# Patient Record
Sex: Male | Born: 1980
Health system: Southern US, Community
[De-identification: ages and names within clinical notes are randomized; demographics above are authoritative.]

## PROBLEM LIST (undated history)

## (undated) DIAGNOSIS — R160 Hepatomegaly, not elsewhere classified: Secondary | ICD-10-CM

## (undated) DIAGNOSIS — R42 Dizziness and giddiness: Secondary | ICD-10-CM

## (undated) DIAGNOSIS — R51 Headache: Secondary | ICD-10-CM

## (undated) DIAGNOSIS — R569 Unspecified convulsions: Secondary | ICD-10-CM

## (undated) DIAGNOSIS — R519 Headache, unspecified: Secondary | ICD-10-CM

## (undated) DIAGNOSIS — S0990XA Unspecified injury of head, initial encounter: Secondary | ICD-10-CM

## (undated) DIAGNOSIS — R55 Syncope and collapse: Secondary | ICD-10-CM

## (undated) DIAGNOSIS — R748 Abnormal levels of other serum enzymes: Secondary | ICD-10-CM

## (undated) HISTORY — DX: Syncope and collapse: R55

## (undated) HISTORY — DX: Dizziness and giddiness: R42

## (undated) HISTORY — DX: Headache: R51

## (undated) HISTORY — DX: Headache, unspecified: R51.9

## (undated) HISTORY — DX: Unspecified injury of head, initial encounter: S09.90XA

## (undated) HISTORY — DX: Abnormal levels of other serum enzymes: R74.8

## (undated) HISTORY — DX: Hepatomegaly, not elsewhere classified: R16.0

---

## 2017-05-17 DIAGNOSIS — R61 Generalized hyperhidrosis: Secondary | ICD-10-CM | POA: Diagnosis not present

## 2017-05-17 DIAGNOSIS — F1010 Alcohol abuse, uncomplicated: Secondary | ICD-10-CM | POA: Diagnosis not present

## 2017-05-17 DIAGNOSIS — R5383 Other fatigue: Secondary | ICD-10-CM | POA: Diagnosis not present

## 2017-05-17 DIAGNOSIS — R079 Chest pain, unspecified: Secondary | ICD-10-CM | POA: Diagnosis not present

## 2017-05-17 DIAGNOSIS — I10 Essential (primary) hypertension: Secondary | ICD-10-CM | POA: Diagnosis not present

## 2017-05-17 DIAGNOSIS — R509 Fever, unspecified: Secondary | ICD-10-CM | POA: Diagnosis not present

## 2017-05-17 DIAGNOSIS — R0789 Other chest pain: Secondary | ICD-10-CM | POA: Diagnosis not present

## 2017-05-17 DIAGNOSIS — D1803 Hemangioma of intra-abdominal structures: Secondary | ICD-10-CM | POA: Diagnosis not present

## 2017-05-18 DIAGNOSIS — D1803 Hemangioma of intra-abdominal structures: Secondary | ICD-10-CM | POA: Diagnosis not present

## 2017-05-18 DIAGNOSIS — R945 Abnormal results of liver function studies: Secondary | ICD-10-CM | POA: Diagnosis not present

## 2017-05-18 DIAGNOSIS — R079 Chest pain, unspecified: Secondary | ICD-10-CM | POA: Diagnosis not present

## 2017-05-18 DIAGNOSIS — Z6826 Body mass index (BMI) 26.0-26.9, adult: Secondary | ICD-10-CM | POA: Diagnosis not present

## 2017-05-18 DIAGNOSIS — R509 Fever, unspecified: Secondary | ICD-10-CM | POA: Diagnosis not present

## 2017-05-19 DIAGNOSIS — R079 Chest pain, unspecified: Secondary | ICD-10-CM | POA: Diagnosis not present

## 2017-05-19 DIAGNOSIS — R509 Fever, unspecified: Secondary | ICD-10-CM | POA: Diagnosis not present

## 2017-05-19 DIAGNOSIS — Z6827 Body mass index (BMI) 27.0-27.9, adult: Secondary | ICD-10-CM | POA: Diagnosis not present

## 2017-05-19 DIAGNOSIS — D1803 Hemangioma of intra-abdominal structures: Secondary | ICD-10-CM | POA: Diagnosis not present

## 2017-05-20 DIAGNOSIS — Z6827 Body mass index (BMI) 27.0-27.9, adult: Secondary | ICD-10-CM | POA: Diagnosis not present

## 2017-05-20 DIAGNOSIS — R079 Chest pain, unspecified: Secondary | ICD-10-CM | POA: Diagnosis not present

## 2017-05-20 DIAGNOSIS — F1010 Alcohol abuse, uncomplicated: Secondary | ICD-10-CM | POA: Diagnosis not present

## 2017-05-20 DIAGNOSIS — R509 Fever, unspecified: Secondary | ICD-10-CM | POA: Diagnosis not present

## 2017-05-20 DIAGNOSIS — D1803 Hemangioma of intra-abdominal structures: Secondary | ICD-10-CM | POA: Diagnosis not present

## 2017-05-21 DIAGNOSIS — Z6826 Body mass index (BMI) 26.0-26.9, adult: Secondary | ICD-10-CM | POA: Diagnosis not present

## 2017-05-21 DIAGNOSIS — F1010 Alcohol abuse, uncomplicated: Secondary | ICD-10-CM | POA: Diagnosis not present

## 2017-05-21 DIAGNOSIS — R509 Fever, unspecified: Secondary | ICD-10-CM | POA: Diagnosis not present

## 2017-05-21 DIAGNOSIS — R079 Chest pain, unspecified: Secondary | ICD-10-CM | POA: Diagnosis not present

## 2017-05-21 DIAGNOSIS — D1803 Hemangioma of intra-abdominal structures: Secondary | ICD-10-CM | POA: Diagnosis not present

## 2017-05-28 DIAGNOSIS — Z79899 Other long term (current) drug therapy: Secondary | ICD-10-CM | POA: Diagnosis not present

## 2017-05-28 DIAGNOSIS — K7689 Other specified diseases of liver: Secondary | ICD-10-CM | POA: Diagnosis not present

## 2017-05-28 DIAGNOSIS — R748 Abnormal levels of other serum enzymes: Secondary | ICD-10-CM | POA: Diagnosis not present

## 2017-05-28 DIAGNOSIS — R079 Chest pain, unspecified: Secondary | ICD-10-CM | POA: Diagnosis not present

## 2017-05-28 DIAGNOSIS — Z6827 Body mass index (BMI) 27.0-27.9, adult: Secondary | ICD-10-CM | POA: Diagnosis not present

## 2017-05-28 DIAGNOSIS — I10 Essential (primary) hypertension: Secondary | ICD-10-CM | POA: Diagnosis not present

## 2017-05-28 DIAGNOSIS — R932 Abnormal findings on diagnostic imaging of liver and biliary tract: Secondary | ICD-10-CM | POA: Diagnosis not present

## 2017-05-28 DIAGNOSIS — F17220 Nicotine dependence, chewing tobacco, uncomplicated: Secondary | ICD-10-CM | POA: Diagnosis not present

## 2017-06-18 DIAGNOSIS — R748 Abnormal levels of other serum enzymes: Secondary | ICD-10-CM | POA: Diagnosis not present

## 2017-06-18 DIAGNOSIS — I10 Essential (primary) hypertension: Secondary | ICD-10-CM | POA: Diagnosis not present

## 2017-06-18 DIAGNOSIS — K769 Liver disease, unspecified: Secondary | ICD-10-CM | POA: Diagnosis not present

## 2017-08-14 DIAGNOSIS — R748 Abnormal levels of other serum enzymes: Secondary | ICD-10-CM | POA: Diagnosis not present

## 2017-08-14 DIAGNOSIS — R200 Anesthesia of skin: Secondary | ICD-10-CM | POA: Diagnosis not present

## 2017-08-14 DIAGNOSIS — S0990XD Unspecified injury of head, subsequent encounter: Secondary | ICD-10-CM | POA: Diagnosis not present

## 2017-08-14 DIAGNOSIS — R55 Syncope and collapse: Secondary | ICD-10-CM | POA: Diagnosis not present

## 2017-08-14 DIAGNOSIS — R160 Hepatomegaly, not elsewhere classified: Secondary | ICD-10-CM | POA: Diagnosis not present

## 2017-09-04 DIAGNOSIS — Z6827 Body mass index (BMI) 27.0-27.9, adult: Secondary | ICD-10-CM | POA: Diagnosis not present

## 2017-09-04 DIAGNOSIS — R748 Abnormal levels of other serum enzymes: Secondary | ICD-10-CM | POA: Diagnosis not present

## 2017-09-04 DIAGNOSIS — R55 Syncope and collapse: Secondary | ICD-10-CM | POA: Diagnosis not present

## 2017-09-11 DIAGNOSIS — R7989 Other specified abnormal findings of blood chemistry: Secondary | ICD-10-CM | POA: Diagnosis not present

## 2017-09-11 DIAGNOSIS — R748 Abnormal levels of other serum enzymes: Secondary | ICD-10-CM | POA: Diagnosis not present

## 2017-10-17 ENCOUNTER — Encounter: Payer: Self-pay | Admitting: *Deleted

## 2017-10-18 ENCOUNTER — Ambulatory Visit: Payer: BLUE CROSS/BLUE SHIELD | Admitting: Neurology

## 2017-10-18 ENCOUNTER — Encounter (INDEPENDENT_AMBULATORY_CARE_PROVIDER_SITE_OTHER): Payer: Self-pay

## 2017-10-18 ENCOUNTER — Encounter: Payer: Self-pay | Admitting: Neurology

## 2017-10-18 VITALS — BP 152/79 | HR 59 | Ht 69.0 in | Wt 192.0 lb

## 2017-10-18 DIAGNOSIS — R410 Disorientation, unspecified: Secondary | ICD-10-CM | POA: Insufficient documentation

## 2017-10-18 MED ORDER — LAMOTRIGINE 25 MG PO TABS: 25.0000 mg | ORAL_TABLET | Freq: Every day | ORAL | 0 refills | Status: DC

## 2017-10-18 MED ORDER — LAMOTRIGINE ER 200 MG PO TB24: 200.0000 mg | ORAL_TABLET | Freq: Every day | ORAL | 11 refills | Status: DC
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
## 2017-10-24 ENCOUNTER — Ambulatory Visit: Payer: BLUE CROSS/BLUE SHIELD | Admitting: Neurology

## 2017-10-24 DIAGNOSIS — R410 Disorientation, unspecified: Secondary | ICD-10-CM

## 2017-10-26 NOTE — Procedures (Signed)
   HISTORY: 37 year old male, presented with frequent dizziness, sensation going to pass out.  TECHNIQUE:  16 channel EEG was performed based on standard 10-16 international system. One channel was dedicated to EKG, which has demonstrates normal sinus rhythm of 60 beats per minutes.  Upon awakening, the posterior background activity was well-developed, in alpha range, with amplitude of microvoltage, reactive to eye opening and closure.  There was no evidence of epileptiform discharge.  Photic stimulation was performed, which induced a symmetric photic driving.  Hyperventilation was performed, there was no abnormality elicit.  No sleep was achieved.  CONCLUSION: This is a  normal awake EEG.  There is no electrodiagnostic evidence of epileptiform discharge.  Levert FeinsteinYijun Janellie Tennison, M.D. Ph.D.  [facility] [address] [place], [place] [address] Phone: [phone] Fax:      [phone]

## 2017-10-31 ENCOUNTER — Telehealth: Payer: Self-pay | Admitting: Neurology

## 2017-10-31 NOTE — Telephone Encounter (Signed)
Pt states they are waiting for a phone call with EEG results. Please call when you have the chance with results. Thank you.

## 2017-10-31 NOTE — Telephone Encounter (Signed)
Spoke to patient - he is aware that his EEG is normal.

## 2017-11-01 ENCOUNTER — Telehealth: Payer: Self-pay | Admitting: Neurology

## 2017-11-01 NOTE — Telephone Encounter (Signed)
I spoke to the patient I informed him the estimate cost for the exam would be about $652.47 due to deductible hasn't been met. I did offer the payment plan. He stated he wants to think about it and then will get back to me when he is ready to schedule.

## 2018-01-22 ENCOUNTER — Ambulatory Visit: Payer: BLUE CROSS/BLUE SHIELD | Admitting: Neurology

## 2018-01-28 ENCOUNTER — Ambulatory Visit: Payer: BLUE CROSS/BLUE SHIELD | Admitting: Neurology

## 2018-01-28 ENCOUNTER — Telehealth: Payer: Self-pay | Admitting: *Deleted

## 2018-01-28 NOTE — Telephone Encounter (Signed)
No showed follow up appointment. 

## 2018-01-29 ENCOUNTER — Encounter: Payer: Self-pay | Admitting: Neurology

## 2019-12-21 ENCOUNTER — Encounter (HOSPITAL_COMMUNITY): Payer: Self-pay | Admitting: Emergency Medicine

## 2019-12-21 ENCOUNTER — Emergency Department (HOSPITAL_COMMUNITY)
Admission: EM | Admit: 2019-12-21 | Discharge: 2019-12-21 | Disposition: A | Discharge status: Home / Self Care | Payer: BC Managed Care – PPO | Attending: Emergency Medicine | Admitting: Emergency Medicine

## 2019-12-21 ENCOUNTER — Emergency Department (HOSPITAL_COMMUNITY): Payer: BC Managed Care – PPO

## 2019-12-21 ENCOUNTER — Other Ambulatory Visit: Payer: Self-pay

## 2019-12-21 DIAGNOSIS — R52 Pain, unspecified: Secondary | ICD-10-CM | POA: Diagnosis not present

## 2019-12-21 DIAGNOSIS — R569 Unspecified convulsions: Secondary | ICD-10-CM | POA: Diagnosis not present

## 2019-12-21 DIAGNOSIS — G40909 Epilepsy, unspecified, not intractable, without status epilepticus: Secondary | ICD-10-CM | POA: Diagnosis not present

## 2019-12-21 DIAGNOSIS — Z79899 Other long term (current) drug therapy: Secondary | ICD-10-CM | POA: Insufficient documentation

## 2019-12-21 DIAGNOSIS — R9431 Abnormal electrocardiogram [ECG] [EKG]: Secondary | ICD-10-CM | POA: Diagnosis not present

## 2019-12-21 DIAGNOSIS — W19XXXA Unspecified fall, initial encounter: Secondary | ICD-10-CM | POA: Diagnosis not present

## 2019-12-21 DIAGNOSIS — M542 Cervicalgia: Secondary | ICD-10-CM | POA: Diagnosis not present

## 2019-12-21 HISTORY — DX: Unspecified convulsions: R56.9

## 2019-12-21 LAB — ETHANOL: Alcohol, Ethyl (B): <10 mg/dL (ref <10)

## 2019-12-21 LAB — RAPID URINE DRUG SCREEN, HOSP PERFORMED
Amphetamines: NOT DETECTED
Barbiturates: NOT DETECTED
Benzodiazepines: NOT DETECTED
Cocaine: NOT DETECTED
Opiates: NOT DETECTED
Tetrahydrocannabinol: POSITIVE — AB

## 2019-12-21 LAB — BASIC METABOLIC PANEL
Anion gap: 11 (ref 5–15)
BUN: 9 mg/dL (ref 6–20)
CO2: 20 mmol/L — ABNORMAL LOW (ref 22–32)
Calcium: 9.4 mg/dL (ref 8.9–10.3)
Chloride: 107 mmol/L (ref 98–111)
Creatinine, Ser: 0.82 mg/dL (ref 0.61–1.24)
GFR calc Af Amer: >60 mL/min (ref >60)
GFR calc non Af Amer: >60 mL/min (ref >60)
Glucose, Bld: 110 mg/dL — ABNORMAL HIGH (ref 70–99)
Potassium: 5.3 mmol/L — ABNORMAL HIGH (ref 3.5–5.1)
Sodium: 138 mmol/L (ref 135–145)

## 2019-12-21 LAB — HEPATIC FUNCTION PANEL
ALT: 45 U/L — ABNORMAL HIGH (ref 0–44)
AST: 29 U/L (ref 15–41)
Albumin: 3.9 g/dL (ref 3.5–5.0)
Alkaline Phosphatase: 73 U/L (ref 38–126)
Bilirubin, Direct: 0.1 mg/dL (ref 0.0–0.2)
Indirect Bilirubin: 0.8 mg/dL (ref 0.3–0.9)
Total Bilirubin: 0.9 mg/dL (ref 0.3–1.2)
Total Protein: 6.2 g/dL — ABNORMAL LOW (ref 6.5–8.1)

## 2019-12-21 LAB — POTASSIUM: Potassium: 4.1 mmol/L (ref 3.5–5.1)

## 2019-12-21 LAB — CBC
HCT: 45 % (ref 39.0–52.0)
Hemoglobin: 15.3 g/dL (ref 13.0–17.0)
MCH: 30.6 pg (ref 26.0–34.0)
MCHC: 34 g/dL (ref 30.0–36.0)
MCV: 90 fL (ref 80.0–100.0)
Platelets: 299 10*3/uL (ref 150–400)
RBC: 5 MIL/uL (ref 4.22–5.81)
RDW: 12.4 % (ref 11.5–15.5)
WBC: 10.1 10*3/uL (ref 4.0–10.5)
nRBC: 0 % (ref 0.0–0.2)

## 2019-12-21 LAB — URINALYSIS, ROUTINE W REFLEX MICROSCOPIC
Bilirubin Urine: NEGATIVE
Glucose, UA: NEGATIVE mg/dL
Hgb urine dipstick: NEGATIVE
Ketones, ur: NEGATIVE mg/dL
Leukocytes,Ua: NEGATIVE
Nitrite: NEGATIVE
Protein, ur: NEGATIVE mg/dL
Specific Gravity, Urine: 1.005 (ref 1.005–1.030)
pH: 7 (ref 5.0–8.0)

## 2019-12-21 MED ORDER — LEVETIRACETAM 500 MG PO TABS: 500.0000 mg | ORAL_TABLET | Freq: Two times a day (BID) | ORAL | 1 refills | Status: DC

## 2019-12-21 MED ORDER — SODIUM CHLORIDE 0.9 % IV BOLUS
1000.0000 mL | Freq: Once | INTRAVENOUS | Status: AC
  Administered 2019-12-21: 1000 mL via INTRAVENOUS

## 2019-12-21 MED ORDER — LEVETIRACETAM IN NACL 1000 MG/100ML IV SOLN
1000.0000 mg | Freq: Once | INTRAVENOUS | Status: AC
  Administered 2019-12-21: 1000 mg via INTRAVENOUS
  Filled 2019-12-21: qty 100

## 2019-12-21 NOTE — Discharge Instructions (Addendum)
Begin taking the levetiracetam (generic for Keppra) immediately.  Continue to take this medication until told otherwise by the neurologist.  Follow-up with the neurologist as soon as possible.  Per [facility] statutes, patients with seizures are not allowed to drive until  they have been seizure-free for six months. Use caution when using heavy equipment or power tools. Avoid working on ladders or at heights. Take showers instead of baths. Ensure the water temperature is not too high on the home water heater. Do not go swimming alone. When caring for infants or small children, sit down when holding, feeding, or changing them to minimize risk of injury to the child in the event you have a seizure.   Also, Maintain good sleep hygiene. Avoid alcohol.   --> Call 911 and bring the patient back to the ED if:                   A.  The seizure lasts longer than 5 minutes.                  B.  The patient doesn't awaken shortly after the seizure             C.  The patient has new problems such as difficulty seeing, speaking or moving             D.  The patient was injured during the seizure             E.  The patient has a temperature over 102 F (39C)             F.  The patient vomited and now is having trouble breathing   For prescription assistance, may try using prescription discount sites or apps, such as goodrx.com

## 2019-12-21 NOTE — ED Notes (Signed)
Patient transported to CT 
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
## 2019-12-21 NOTE — ED Notes (Signed)
Pt back from CT
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
## 2019-12-22 ENCOUNTER — Telehealth: Payer: Self-pay | Admitting: Neurology

## 2019-12-22 NOTE — Telephone Encounter (Signed)
ED has referred patient back to [place] for seizure-like activity. It looks like they were wanting pt to f/u w/ Dr. Terrace Arabia pretty soon. Dr. Terrace Arabia, could you review urgency?

## 2019-12-22 NOTE — Telephone Encounter (Signed)
Yes, we need to see him soon, it is OK to put on Sarah's schedule

## 2019-12-23 ENCOUNTER — Encounter: Payer: Self-pay | Admitting: Neurology

## 2019-12-23 ENCOUNTER — Ambulatory Visit: Payer: BC Managed Care – PPO | Admitting: Neurology

## 2019-12-23 ENCOUNTER — Other Ambulatory Visit: Payer: Self-pay

## 2019-12-23 VITALS — BP 142/82 | HR 61 | Temp 97.3°F | Ht 69.0 in | Wt 194.0 lb

## 2019-12-23 DIAGNOSIS — G40209 Localization-related (focal) (partial) symptomatic epilepsy and epileptic syndromes with complex partial seizures, not intractable, without status epilepticus: Secondary | ICD-10-CM | POA: Insufficient documentation

## 2019-12-23 MED ORDER — LEVETIRACETAM ER 500 MG PO TB24: 1000.0000 mg | ORAL_TABLET | Freq: Every day | ORAL | 4 refills | Status: AC

## 2019-12-23 NOTE — Progress Notes (Signed)
PATIENT: Dustin Sharp DOB: [birth year]  Chief Complaint  Patient presents with  . Seizures    He is here with his wife, Trudie Buckler. ED follow up for seizure-like activity on 12/21/19. Reports onset of seizures in 2017. He is prescribed Keppra 500mg , one tab BID but only takes is once daily. He has a hard time remembering it.  PCP    [name], MD (referred from hospital)     HISTORICAL  Dustin Sharp, is accompanied by his wife to follow-up for seizure  He presented to emergency room on December 21, 2019 following a seizure, he was in sleep, his wife heard a loud moaning sound, when she check on him, he was noted to leaning in his back, with hyperextension of his neck and back, foaming and blood coming out of his mouth, he has bitten his tongue, EMS was called, patient has no recollection of the event, he remember he was in the sleep, then woke up still in the bed achy all over, with the EMS surrounding him  He was taken to the emergency room, I personally reviewed CT head without contrast that was normal,  Laboratory evaluation: UDS was positive for marijuana, normal liver functional test, negative alcohol, normal CBC, BMP showed elevated potassium 5.3  He was discharged with Keppra 500 mg twice daily, just started taking that yesterday December 22, 2019, only 1 dose at nighttime, he has hard time remembering taking it twice  I have seen him in 2019, he had a history of heavy alcohol use, up to 12 beers each day, he quit suddenly in August 2018, aft he was found to have right hepatic lobe lesion, that is most consistent with atypical hemangioma, shortly afterwards, he suffered left arm paresthesia, chest pain, fever, require hospital admission  Since then, he began to have recurrent spells of sudden onset dj vu, once every few weeks, sometimes can happen frequently, sometimes can have few weeks in between, he described sudden protrusion of the dj vu, weird feeling, lasting for few  minutes, followed by extreme fatigue afterwards  Suspicious for partial seizure, I ordered MRI of brain with without contrast, EEG in January 2019, give him prescription of lamotrigine, but  he lost to follow-up, never take medication,  Over the years, he continue have frequent spells of dj vu, usually in the early morning times when he first wake up from overnight sleep, he described it as has been to a familiar seeing, dizziness, rushing of the thoughts, euphoria sensation, lasting for few minutes,  He did not have a history of head injury, a tree limb fell on his head in 2014 with loss of consciousness in 2014, but he did not have spells following his head injury, this only happened after he withdraw from alcohol abruptly in August 2018, with associated fever then,  REVIEW OF SYSTEMS: Full 14 system review of systems performed and notable only for as above All other review of systems were negative.  ALLERGIES: No Known Allergies  HOME MEDICATIONS: Current Outpatient Medications  Medication Sig Dispense Refill  . levETIRAcetam (KEPPRA) 500 MG tablet Take 1 tablet (500 mg total) by mouth 2 (two) times daily. 60 tablet 1   No current facility-administered medications for this visit.    PAST MEDICAL HISTORY: Past Medical History:  Diagnosis Date  . Dizziness   . Elevated liver enzymes   . Head injury    2014  . Headache   . Liver mass   . Near syncope   .  Seizures (HCC)     PAST SURGICAL HISTORY: History reviewed. No pertinent surgical history.  FAMILY HISTORY: Family History  Problem Relation Age of Onset  . Hypertension Mother   . Hypertension Father     SOCIAL HISTORY: Social History   Socioeconomic History  . Marital status: Married    Spouse name: Not on file  . Number of children: 1  . Years of education: 35  . Highest education level: High school graduate  Occupational History  . Occupation: Scientist, water quality  Tobacco Use  . Smoking status: Never Smoker  .  Smokeless tobacco: Never Used  Substance and Sexual Activity  . Alcohol use: No  . Drug use: No  . Sexual activity: Not on file  Other Topics Concern  . Not on file  Social History Narrative   Lives at home with his wife and child.   Right-handed.   One soda per day.   Social Determinants of Health   Financial Resource Strain:   . Difficulty of Paying Living Expenses:   Food Insecurity:   . Worried About Programme researcher, broadcasting/film/video in the Last Year:   . Barista in the Last Year:   Transportation Needs:   . Freight forwarder (Medical):   [name] Lack of Transportation (Non-Medical):   Physical Activity:   . Days of Exercise per Week:   . Minutes of Exercise per Session:   Stress:   . Feeling of Stress :   Social Connections:   . Frequency of Communication with Friends and Family:   . Frequency of Social Gatherings with Friends and Family:   . Attends Religious Services:   . Active Member of Clubs or Organizations:   . Attends Banker Meetings:   [name] Marital Status:   Intimate Partner Violence:   . Fear of Current or Ex-Partner:   . Emotionally Abused:   [name] Physically Abused:   . Sexually Abused:      PHYSICAL EXAM   Vitals:   12/23/19 0910  BP: (!) 142/82  Pulse: 61  Temp: (!) 97.3 F (36.3 C)  Weight: 194 lb (88 kg)  Height: 5\' 9"  (1.753 m)    Not recorded      Body mass index is 28.65 kg/m.  PHYSICAL EXAMNIATION:  Gen: NAD, conversant, well nourised, well groomed                     Cardiovascular: Regular rate rhythm, no peripheral edema, warm, nontender. Eyes: Conjunctivae clear without exudates or hemorrhage Neck: Supple, no carotid bruits. Pulmonary: Clear to auscultation bilaterally   NEUROLOGICAL EXAM:  MENTAL STATUS: Speech:    Speech is normal; fluent and spontaneous with normal comprehension.  Cognition:     Orientation to time, place and person     Normal recent and remote memory     Normal Attention span and  concentration     Normal Language, naming, repeating,spontaneous speech     Fund of knowledge   CRANIAL NERVES: CN II: Visual fields are full to confrontation. Pupils are round equal and briskly reactive to light. CN III, IV, VI: extraocular movement are normal. No ptosis. CN V: Facial sensation is intact to light touch CN VII: Face is symmetric with normal eye closure  CN VIII: Hearing is normal to causal conversation. CN IX, X: Phonation is normal. CN XI: Head turning and shoulder shrug are intact  MOTOR: There is no pronator drift of out-stretched arms. Muscle bulk and  tone are normal. Muscle strength is normal.  REFLEXES: Reflexes are 2+ and symmetric at the biceps, triceps, knees, and ankles. Plantar responses are flexor.  SENSORY: Intact to light touch, pinprick and vibratory sensation are intact in fingers and toes.  COORDINATION: There is no trunk or limb dysmetria noted.  GAIT/STANCE: Posture is normal. Gait is steady with normal steps, base, arm swing, and turning. Heel and toe walking are normal. Tandem gait is normal.  Romberg is absent.   DIAGNOSTIC DATA (LABS, IMAGING, TESTING) - I reviewed patient records, labs, notes, testing and imaging myself where available.   ASSESSMENT AND PLAN  Dustin Sharp is a 39 y.o. 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 Ph.D.  [facility] [address], [place], [place] [address] Ph: [phone] Fax: [phone]  CC: Serita Grammes, MD

## 2019-12-29 ENCOUNTER — Telehealth: Payer: Self-pay | Admitting: Neurology

## 2019-12-29 NOTE — Telephone Encounter (Signed)
unable to get a hold of the patient the vmail has not been set up  [employer]: [id number] (exp. 12/29/19 to 06/25/20)

## 2020-01-02 ENCOUNTER — Telehealth: Payer: Self-pay | Admitting: Neurology

## 2020-01-02 NOTE — Telephone Encounter (Signed)
Pt wife(Pilant, HEIDI) is asking to be called for the scheduling of the MRI

## 2020-01-05 NOTE — Telephone Encounter (Signed)
I spoke to [facility] on the DPR she is going to give me a call back to schedule.  [employer]: [id number] (exp. 12/29/19 to 06/25/20)

## 2020-01-05 NOTE — Telephone Encounter (Signed)
Patient is scheduled at [facility] for 01/14/20.

## 2020-01-07 ENCOUNTER — Ambulatory Visit: Payer: BC Managed Care – PPO | Admitting: Neurology

## 2020-01-07 ENCOUNTER — Other Ambulatory Visit: Payer: BC Managed Care – PPO

## 2020-01-07 DIAGNOSIS — G40209 Localization-related (focal) (partial) symptomatic epilepsy and epileptic syndromes with complex partial seizures, not intractable, without status epilepticus: Secondary | ICD-10-CM | POA: Diagnosis not present

## 2020-01-07 NOTE — Procedures (Signed)
   HISTORY: 39 year old male, presented with seizure on December 21, 2019.   TECHNIQUE:  This is a routine 16 channel EEG recording with one channel devoted to a limited EKG recording.  It was performed during wakefulness, drowsiness and asleep.  Hyperventilation and photic stimulation were performed as activating procedures.  There are minimum muscle and movement artifact noted.  Upon maximum arousal, posterior dominant waking rhythm consistent of rhythmic alpha range activity, with frequency of 10 hz. Activities are symmetric over the bilateral posterior derivations and attenuated with eye opening.  Hyperventilation produced mild/moderate buildup with higher amplitude and the slower activities noted.  Photic stimulation did not alter the tracing.  During EEG recording, patient developed drowsiness and no deeper stage of sleep was achieved During EEG recording, there was no epileptiform discharge noted.  EKG demonstrate sinus rhythm, with heart rate of 60 bpm  CONCLUSION: This is a  normal awake EEG.  There is no electrodiagnostic evidence of epileptiform discharge.  Levert Feinstein, M.D. Ph.D.  [facility] [address] [place], [place] [address] Phone: [phone] Fax:      [phone]

## 2020-01-14 ENCOUNTER — Ambulatory Visit: Payer: BC Managed Care – PPO

## 2020-01-14 DIAGNOSIS — G40209 Localization-related (focal) (partial) symptomatic epilepsy and epileptic syndromes with complex partial seizures, not intractable, without status epilepticus: Secondary | ICD-10-CM

## 2020-01-14 MED ORDER — GADOBENATE DIMEGLUMINE 529 MG/ML IV SOLN
15.0000 mL | Freq: Once | INTRAVENOUS | Status: AC | PRN
  Administered 2020-01-14: 12:00:00 15 mL via INTRAVENOUS

## 2020-01-15 ENCOUNTER — Telehealth: Payer: Self-pay | Admitting: Neurology

## 2020-01-15 NOTE — Telephone Encounter (Signed)
Pt's wife [place]idi on [place] called wanting to know if the pt's EEG results are in. Please advise.

## 2020-01-15 NOTE — Telephone Encounter (Signed)
CONCLUSION: This is a  normal awake EEG.  There is no electrodiagnostic evidence of epileptiform discharge.  I left a detailed message (ok per DPR) that his EEG was normal. I informed them that another call would be made to them once his MRI brain results are back. I also directed him to continue taking the Keppra XR as prescribed and still no driving until he is six months event free (per [place] law). Provided our number to call back with any questions.

## 2020-01-16 ENCOUNTER — Telehealth: Payer: Self-pay | Admitting: *Deleted

## 2020-01-16 NOTE — Telephone Encounter (Signed)
-----   Message from Levert Feinstein, MD sent at 01/15/2020  6:45 PM EDT ----- Please call pt for normal MRI brain.

## 2020-01-16 NOTE — Telephone Encounter (Signed)
I spoke to the patient and he verbalized understanding of his MRI brain results.  

## 2020-02-17 ENCOUNTER — Telehealth: Payer: Self-pay | Admitting: Neurology

## 2020-02-17 ENCOUNTER — Encounter: Payer: Self-pay | Admitting: *Deleted

## 2020-02-17 MED ORDER — LAMOTRIGINE 25 MG PO TABS: ORAL_TABLET | ORAL | 0 refills | Status: AC

## 2020-02-17 MED ORDER — LAMOTRIGINE ER 200 MG PO TB24: 200.0000 mg | ORAL_TABLET | Freq: Every day | ORAL | 3 refills | Status: AC

## 2020-02-17 NOTE — Telephone Encounter (Signed)
Pt's wife(on DPR) has called to report that Keppra is causing: anger issues, not working and causing alopecia for pt.  Please call

## 2020-02-17 NOTE — Addendum Note (Signed)
Addended by: Lilla Shook on: 02/17/2020 05:42 PM   Modules accepted: Orders

## 2020-02-17 NOTE — Telephone Encounter (Signed)
Week Lamotrigine Keppra xr 500mg   1st week 25mg  twice a day 2 tab every night  2nd week 25mg x2 tab twice aday 2 tab every night  3rd week 25mg x3tab twice aday 1 tab every night  4th week 100mg  twice a day stop   The other options are once he finished titrating Lamotrigine to 100mg  bid, may switch to Lamotrigine xr 200mg  qhs

## 2020-02-17 NOTE — Telephone Encounter (Signed)
I spoke to the patient's wife and reviewed the plan below in detail. I requested she write the directions down and she read them back to me correctly. She feels like he will do better by eventually transitioning to lamotrigine XR 200mg  QHS. He will titrate up with the 25mg  for four weeks then start the extended release medication. Two separate prescriptions have been sent to the pharmacy. Provided our number for her to call back with any questions. She also verbalized understanding to contact our office immediately to report any rash that may develop after he starts lamotrigine.

## 2020-02-17 NOTE — Telephone Encounter (Signed)
I spoke to his wife. In addition to the complaints below, she reports that he is still having feelings of deja vu. His wife states he has been compliant with the Keppra XR 500mg , two tabs QHS. They would like to discuss an alternate medications.

## 2020-02-17 NOTE — Addendum Note (Signed)
Addended by: Lindell Spar C on: 02/17/2020 11:07 AM   Modules accepted: Orders

## 2020-04-30 DIAGNOSIS — Z5329 Procedure and treatment not carried out because of patient's decision for other reasons: Secondary | ICD-10-CM | POA: Diagnosis not present

## 2020-04-30 DIAGNOSIS — R569 Unspecified convulsions: Secondary | ICD-10-CM | POA: Diagnosis not present

## 2020-05-04 DIAGNOSIS — D72829 Elevated white blood cell count, unspecified: Secondary | ICD-10-CM | POA: Diagnosis not present

## 2020-05-04 DIAGNOSIS — R7401 Elevation of levels of liver transaminase levels: Secondary | ICD-10-CM | POA: Diagnosis not present

## 2020-05-04 DIAGNOSIS — G40209 Localization-related (focal) (partial) symptomatic epilepsy and epileptic syndromes with complex partial seizures, not intractable, without status epilepticus: Secondary | ICD-10-CM | POA: Diagnosis not present

## 2020-05-04 DIAGNOSIS — Z6826 Body mass index (BMI) 26.0-26.9, adult: Secondary | ICD-10-CM | POA: Diagnosis not present

## 2020-06-22 DIAGNOSIS — G40209 Localization-related (focal) (partial) symptomatic epilepsy and epileptic syndromes with complex partial seizures, not intractable, without status epilepticus: Secondary | ICD-10-CM | POA: Diagnosis not present

## 2020-06-24 ENCOUNTER — Ambulatory Visit: Payer: BC Managed Care – PPO | Admitting: Neurology

## 2021-12-08 DIAGNOSIS — Z0000 Encounter for general adult medical examination without abnormal findings: Secondary | ICD-10-CM | POA: Diagnosis not present

## 2021-12-08 DIAGNOSIS — E785 Hyperlipidemia, unspecified: Secondary | ICD-10-CM | POA: Diagnosis not present

## 2021-12-08 DIAGNOSIS — Z6829 Body mass index (BMI) 29.0-29.9, adult: Secondary | ICD-10-CM | POA: Diagnosis not present

## 2021-12-08 DIAGNOSIS — Z1331 Encounter for screening for depression: Secondary | ICD-10-CM | POA: Diagnosis not present

## 2021-12-08 DIAGNOSIS — Z2821 Immunization not carried out because of patient refusal: Secondary | ICD-10-CM | POA: Diagnosis not present

## 2021-12-12 DIAGNOSIS — R7401 Elevation of levels of liver transaminase levels: Secondary | ICD-10-CM | POA: Diagnosis not present

## 2021-12-12 DIAGNOSIS — E559 Vitamin D deficiency, unspecified: Secondary | ICD-10-CM | POA: Diagnosis not present

## 2021-12-12 DIAGNOSIS — R635 Abnormal weight gain: Secondary | ICD-10-CM | POA: Diagnosis not present

## 2021-12-14 DIAGNOSIS — R7401 Elevation of levels of liver transaminase levels: Secondary | ICD-10-CM | POA: Diagnosis not present

## 2021-12-14 DIAGNOSIS — K760 Fatty (change of) liver, not elsewhere classified: Secondary | ICD-10-CM | POA: Diagnosis not present

## 2021-12-14 DIAGNOSIS — R945 Abnormal results of liver function studies: Secondary | ICD-10-CM | POA: Diagnosis not present

## 2022-01-11 DIAGNOSIS — Z6828 Body mass index (BMI) 28.0-28.9, adult: Secondary | ICD-10-CM | POA: Diagnosis not present

## 2022-01-11 DIAGNOSIS — R7401 Elevation of levels of liver transaminase levels: Secondary | ICD-10-CM | POA: Diagnosis not present

## 2022-01-11 DIAGNOSIS — E559 Vitamin D deficiency, unspecified: Secondary | ICD-10-CM | POA: Diagnosis not present

## 2022-01-11 DIAGNOSIS — K760 Fatty (change of) liver, not elsewhere classified: Secondary | ICD-10-CM | POA: Diagnosis not present

## 2022-01-26 IMAGING — CT CT HEAD W/O CM
4 series · 16 of 47 positions shown, 18 images · non-contrast
Comparison: CT head 09/12/2013

CLINICAL DATA: Seizure.

EXAM:
CT HEAD WITHOUT CONTRAST
TECHNIQUE: Contiguous axial images were obtained from the base of the skull
through the vertex without intravenous contrast.

[Series 3: head wo · axial · 0.46mm/px · z∈[+1372,+1492]mm · 7 of 34 slices shown, 9 images]
[im 5/34  brain]
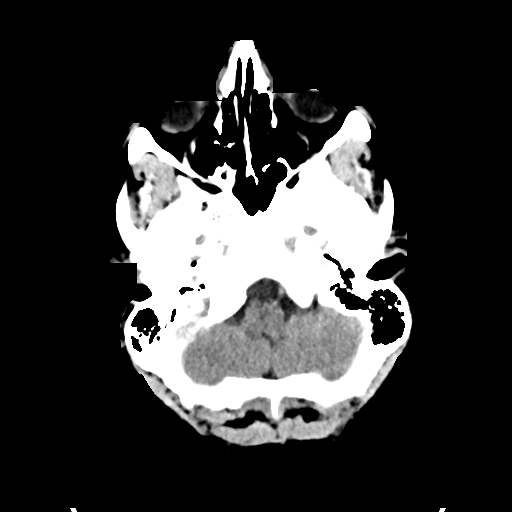
[im 5/34  bone]
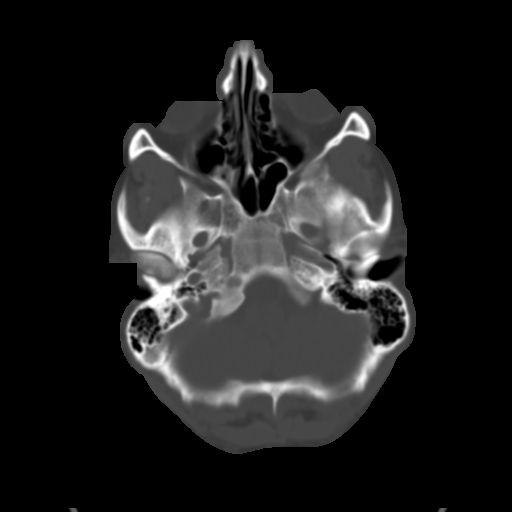
[im 9/34  brain]
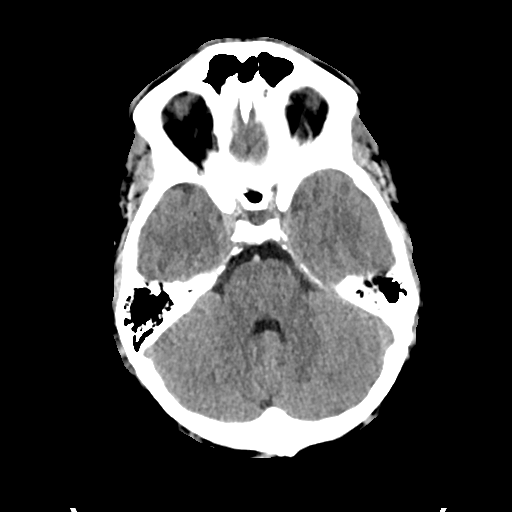
[im 13/34  brain]
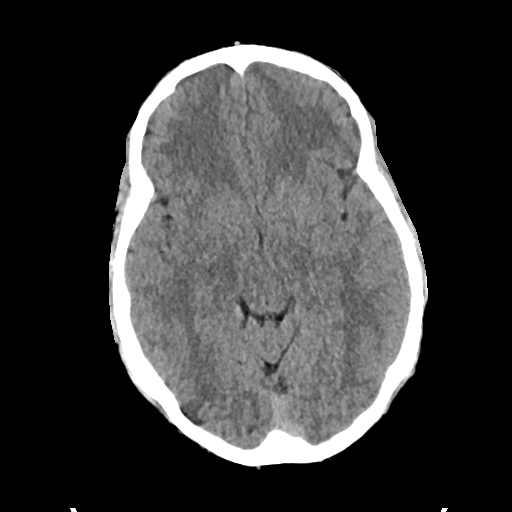
[im 17/34  brain]
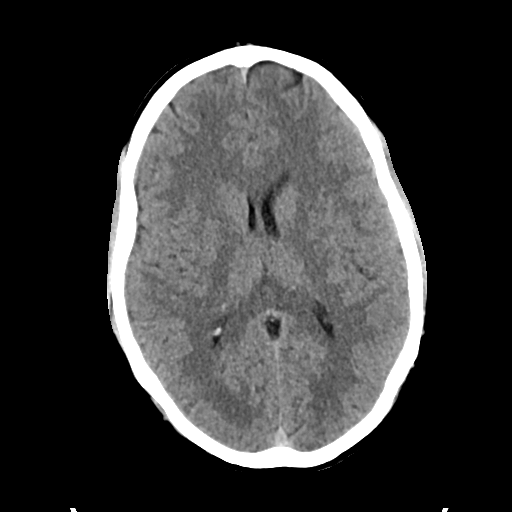
[im 21/34  brain]
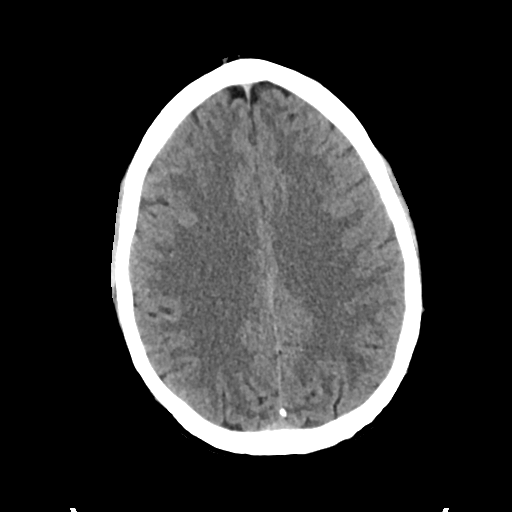
[im 21/34  bone]
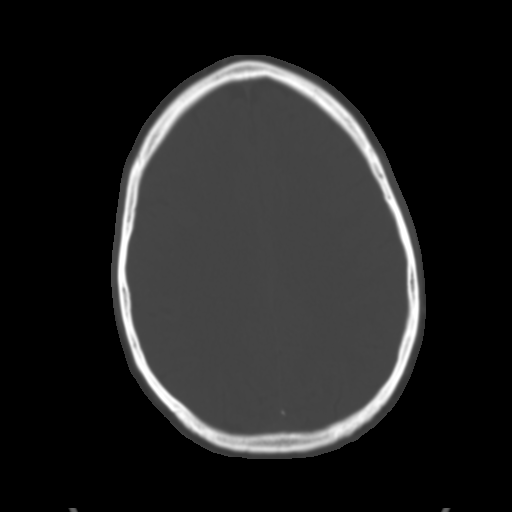
[im 25/34  brain]
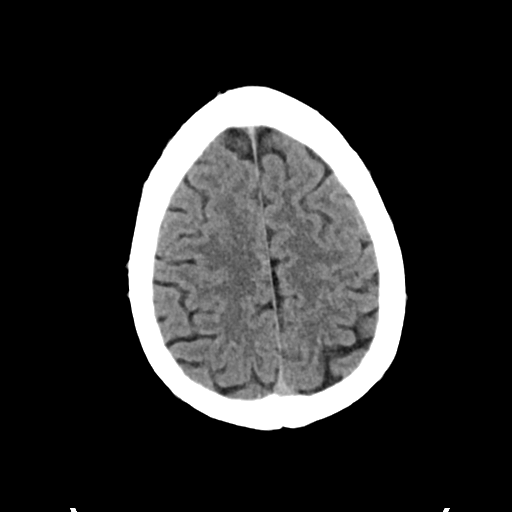
[im 29/34  brain]
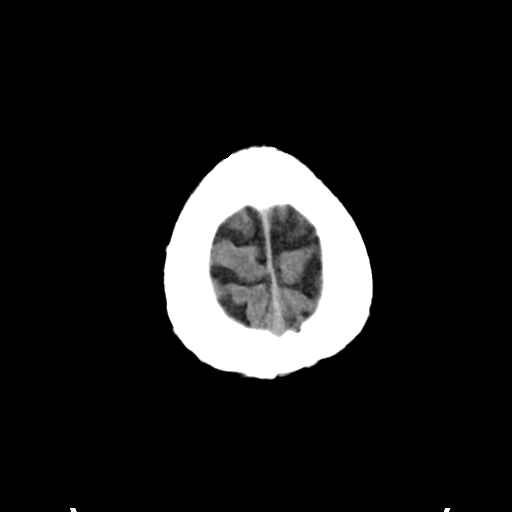

[Series 4: head bone · axial · 0.46mm/px · z∈[+1368,+1400]mm · 3 of 84 slices shown]
[im 9/84  bone]
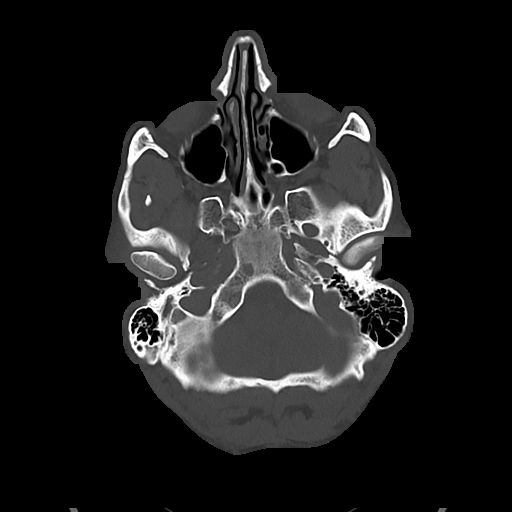
[im 17/84  bone]
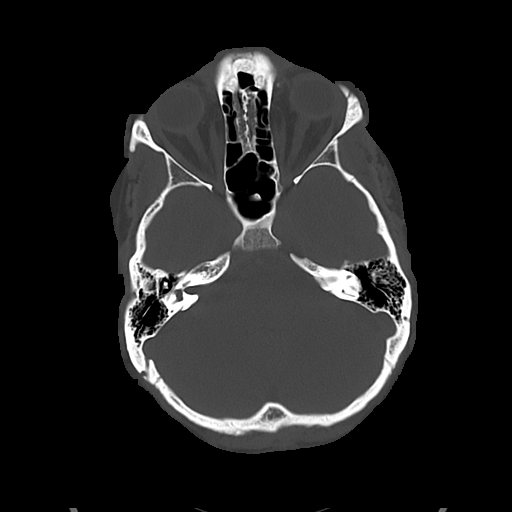
[im 25/84  bone]
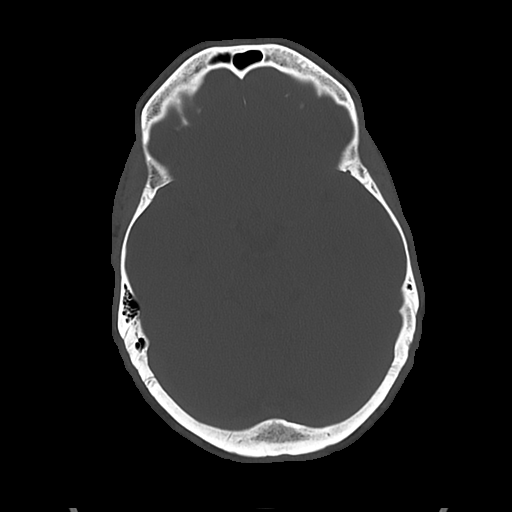

[Series 5: cor soft · coronal · 0.34mm/px · 3 of 80 slices shown]
[im 27/80  brain]
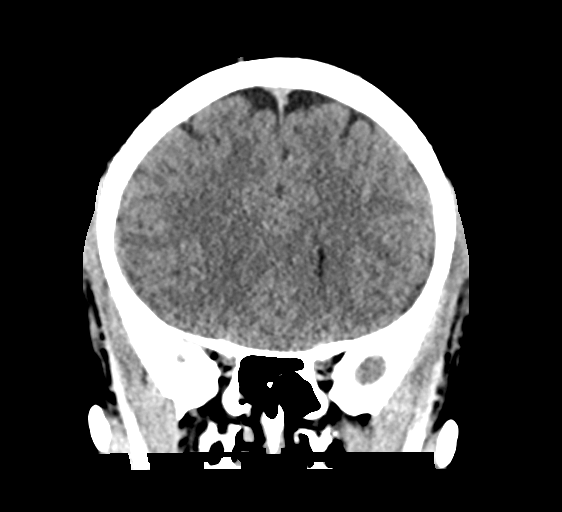
[im 36/80  brain]
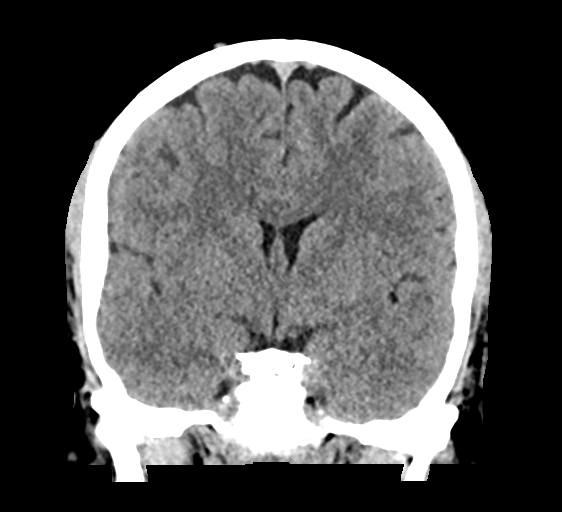
[im 44/80  brain]
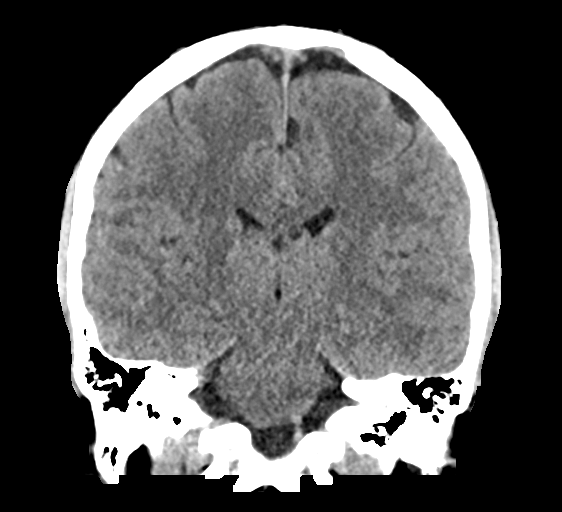

[Series 6: sag soft · sagittal · 0.34mm/px · 3 of 64 slices shown]
[im 22/64  brain]
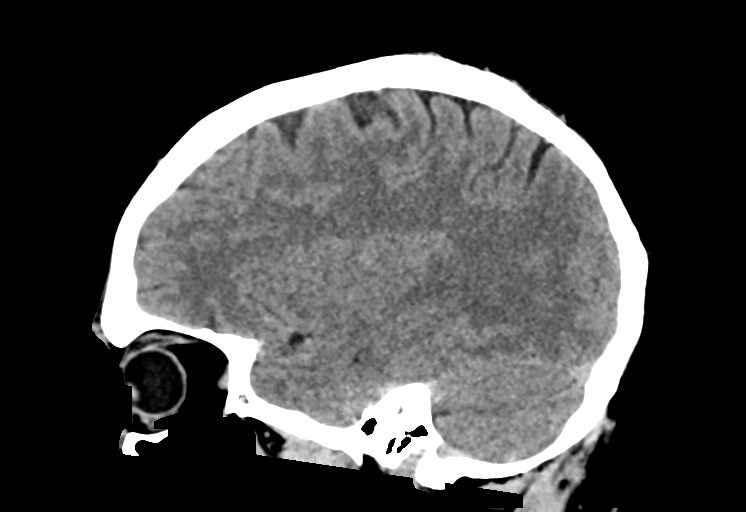
[im 32/64  brain]
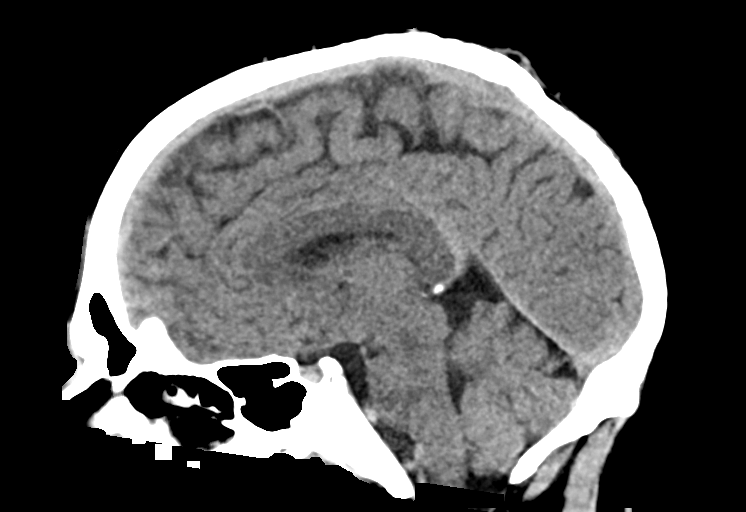
[im 43/64  brain]
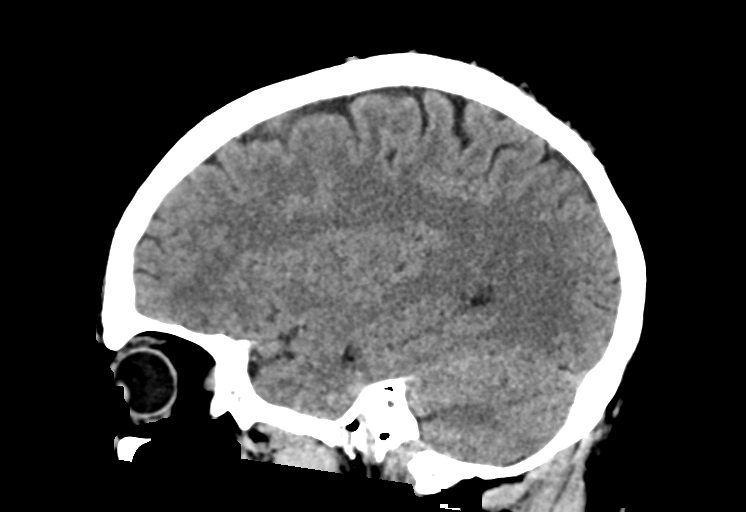

[16 of 47 positions shown; findings below may reference images not displayed]

FINDINGS: Brain: No evidence of acute infarction, hemorrhage, hydrocephalus,
extra-axial collection or mass lesion/mass effect.

Vascular: No hyperdense vessel or unexpected calcification.

Skull: Normal. Negative for fracture or focal lesion.

Sinuses/Orbits: No acute finding.

Other: None.
IMPRESSION: No acute intracranial pathology.
